# Patient Record
Sex: Female | Born: 1975 | Race: White | Hispanic: No | Marital: Married | Smoking: Never smoker
Health system: Southern US, Community
[De-identification: ages and names within clinical notes are randomized; demographics above are authoritative.]

---

## 1998-11-08 HISTORY — PX: BREAST CYST ASPIRATION: SHX578

## 2011-04-07 ENCOUNTER — Inpatient Hospital Stay: Payer: Self-pay | Admitting: Obstetrics and Gynecology

## 2012-03-21 ENCOUNTER — Ambulatory Visit: Payer: Self-pay

## 2012-11-16 ENCOUNTER — Inpatient Hospital Stay: Payer: Self-pay | Admitting: Obstetrics and Gynecology

## 2012-11-16 LAB — PIH PROFILE
BUN: 5 mg/dL — ABNORMAL LOW (ref 7–18)
Chloride: 108 mmol/L — ABNORMAL HIGH (ref 98–107)
Co2: 24 mmol/L (ref 21–32)
Creatinine: 0.53 mg/dL — ABNORMAL LOW (ref 0.60–1.30)
EGFR (African American): >60
EGFR (Non-African Amer.): >60
Glucose: 70 mg/dL (ref 65–99)
HCT: 35.8 % (ref 35.0–47.0)
HGB: 12.4 g/dL (ref 12.0–16.0)
MCH: 31.2 pg (ref 26.0–34.0)
MCHC: 34.8 g/dL (ref 32.0–36.0)
MCV: 90 fL (ref 80–100)
RDW: 13.3 % (ref 11.5–14.5)
SGOT(AST): 23 U/L (ref 15–37)
Uric Acid: 3.7 mg/dL (ref 2.6–6.0)
WBC: 12.5 10*3/uL — ABNORMAL HIGH (ref 3.6–11.0)

## 2012-11-16 LAB — PROTEIN / CREATININE RATIO, URINE
Creatinine, Urine: 33 mg/dL (ref 30.0–125.0)
Protein/Creat. Ratio: 6939 mg/gCREAT — ABNORMAL HIGH (ref 0–200)

## 2012-11-16 LAB — PLATELET COUNT: Platelet: 228 10*3/uL (ref 150–440)

## 2012-11-17 LAB — PIH PROFILE
BUN: 5 mg/dL — ABNORMAL LOW (ref 7–18)
Calcium, Total: 6.9 mg/dL — CL (ref 8.5–10.1)
Chloride: 109 mmol/L — ABNORMAL HIGH (ref 98–107)
Co2: 21 mmol/L (ref 21–32)
EGFR (Non-African Amer.): >60
Glucose: 152 mg/dL — ABNORMAL HIGH (ref 65–99)
HCT: 37.8 % (ref 35.0–47.0)
MCH: 30.6 pg (ref 26.0–34.0)
Osmolality: 281 (ref 275–301)
RDW: 13.2 % (ref 11.5–14.5)
Sodium: 141 mmol/L (ref 136–145)
WBC: 18 10*3/uL — ABNORMAL HIGH (ref 3.6–11.0)

## 2012-11-17 LAB — MAGNESIUM: Magnesium: 4.7 mg/dL — ABNORMAL HIGH

## 2012-11-18 LAB — BASIC METABOLIC PANEL
Anion Gap: 8 (ref 7–16)
BUN: 7 mg/dL (ref 7–18)
Calcium, Total: 6.8 mg/dL — CL (ref 8.5–10.1)
Chloride: 110 mmol/L — ABNORMAL HIGH (ref 98–107)
Co2: 25 mmol/L (ref 21–32)
EGFR (African American): >60
Osmolality: 282 (ref 275–301)
Potassium: 3 mmol/L — ABNORMAL LOW (ref 3.5–5.1)
Sodium: 143 mmol/L (ref 136–145)

## 2012-11-18 LAB — HEMATOCRIT: HCT: 33.6 % — ABNORMAL LOW (ref 35.0–47.0)

## 2012-11-19 LAB — BASIC METABOLIC PANEL
Anion Gap: 9 (ref 7–16)
Chloride: 109 mmol/L — ABNORMAL HIGH (ref 98–107)
Co2: 23 mmol/L (ref 21–32)
Creatinine: 0.59 mg/dL — ABNORMAL LOW (ref 0.60–1.30)
EGFR (Non-African Amer.): >60
Glucose: 79 mg/dL (ref 65–99)
Potassium: 3.1 mmol/L — ABNORMAL LOW (ref 3.5–5.1)
Sodium: 141 mmol/L (ref 136–145)

## 2012-11-20 LAB — PIH PROFILE
Anion Gap: 8 (ref 7–16)
BUN: 8 mg/dL (ref 7–18)
Calcium, Total: 8.1 mg/dL — ABNORMAL LOW (ref 8.5–10.1)
Chloride: 108 mmol/L — ABNORMAL HIGH (ref 98–107)
EGFR (African American): >60
Glucose: 80 mg/dL (ref 65–99)
HCT: 34.1 % — ABNORMAL LOW (ref 35.0–47.0)
HGB: 11.5 g/dL — ABNORMAL LOW (ref 12.0–16.0)
MCH: 31.2 pg (ref 26.0–34.0)
MCHC: 33.8 g/dL (ref 32.0–36.0)
MCV: 92 fL (ref 80–100)
Platelet: 313 10*3/uL (ref 150–440)
RBC: 3.69 10*6/uL — ABNORMAL LOW (ref 3.80–5.20)
RDW: 13.3 % (ref 11.5–14.5)
Uric Acid: 4.1 mg/dL (ref 2.6–6.0)

## 2014-10-14 ENCOUNTER — Ambulatory Visit: Payer: Self-pay | Admitting: Internal Medicine

## 2015-02-28 NOTE — Consult Note (Signed)
PATIENT NAME:  Jessica Frost, Jessica Frost MR#:  [id number] DATE OF BIRTH:  1975/12/14  DATE OF CONSULTATION:  11/17/2012  PRIMARY CARE PHYSICIAN: None.   REFERRING PHYSICIAN:  Dr. Levonne LappingEvans/Dr. Schermerhorn Obstetrician/Gynecology.    CONSULTING PHYSICIAN:  Rowan Pollman A. Allena KatzPatel, MD  REASON FOR CONSULTATION: Pregnancy-induced hypertension.   HISTORY OF PRESENT ILLNESS: The patient is a 39 year old Caucasian female with no significant past medical history, was admitted on the Labor and Delivery Suite and who delivered a healthy baby boy on 11/17/2012.  The patient was admitted with preeclampsia and is currently receiving IV Maxalt.  Postpartum, the patient's blood pressure remained anywhere from systolic 160s to 578I180s. Her diastolic is anywhere from 90s to 116s. She had earlier a headache, which resolved with ibuprofen. Denies any fuzzy vision. No seizures were reported. The patient did not have issues with blood pressure in her prior pregnancy; however, she tells me she has not lost all the weight from her previous pregnancy, which was 19 months ago. Denies any chest pain, shortness of breath. Internal medicine was consulted for blood pressure management.   PAST MEDICAL HISTORY: None.   MEDICATIONS: Prenatal vitamins.   CURRENT Hospital medications: Are:   1.  Maxalt drip.  2.  Dextrose 5% lactated ringers.  3.  Tylenol 650 every 3 to 4 hours p.r.n.   4.  Hydrocodone.  5.  Norco 5/325 one to two every four to six hours p.r.n.  6.  Bisacodyl suppository 10 mg rectal every six p.r.n.  7.  Maalox 30 mL every 4 to 6 p.r.n.  8.  Ibuprofen 600 mg p.o. every 6 p.r.n. for pain or temperature.  9.  Milk of magnesia 30 mL p.o. at bedtime p.r.n.  10.  Simethicone 80 mg before meals and at bedtime p.r.n.  11.  Temazepam 30 mg at bedtime p.r.n. for insomnia.  12.  Tucks single pad 1 to 2 hourly p.r.n. for perineal discomfort.  13.  Promethazine 25 mg every 4 p.r.n. for nausea, vomiting, motion sickness.   FAMILY HISTORY:  Positive for diabetes in father. Mother had GERD.   ALLERGIES: No known drug allergies.   SOCIAL HISTORY: Nonsmoker, nonalcoholic. She is a Futures traderhomemaker.   Review of systems:  CONSTITUTIONAL: No fever, fatigue, weakness.  EYES: No blurred or double vision.  ENT: No hearing loss, tinnitus, or ear pain.  RESPIRATORY: No cough, wheeze, hemoptysis.  CARDIOVASCULAR: No chest pain, orthopnea, or edema.  GASTROINTESTINAL: No nausea, vomiting, diarrhea, or abdominal pain.  GENITOURINARY: No dysuria or hematuria.  ENDOCRINE: No polyuria or nocturia.  HEMATOLOGY: No anemia or easy bruising.  SKIN: No acne or rash.  MUSCULOSKELETAL: No arthritis or joint pain.  NEUROLOGIC: No CVA, TIA, or seizures.  PSYCHIATRIC: No anxiety or depression.   All other systems reviewed and negative.   PHYSICAL EXAMINATION:  GENERAL: The patient is awake, alert, oriented x3, not in acute distress.  VITAL SIGNS: Afebrile.  Pulse is 79, regular. Blood pressure is 164/99.   HEAD, EYES, EARS, NOSE, AND THROAT: Atraumatic, normocephalic. Pupils PERRLA.  EOMI intact.  Oral mucosa is moist.  NECK: Supple. No JVD. No carotid bruit.  RESPIRATORY: Clear to auscultation bilaterally. No rales, rhonchi, respiratory distress, or labored breathing.  CARDIOVASCULAR: Both the heart sounds are normal. Rate, rhythm is regular. PMI not lateralized. Chest is nontender.  EXTREMITIES: Good pedal pulses. Good femoral pulses. There is 2+ pitting edema in both lower extremities, more in the ankle and feet.  NEUROLOGIC: Grossly intact cranial nerves II through XII. No motor  or sensory deficits.  PSYCHiatric: The patient is awake, alert, oriented x3.  SKIN: Warm and dry.  ABDOMEN: Soft, benign, and nontender. No organomegaly.   Laboratory data: White count is 18.0, hemoglobin and hematocrit is 12.8 and 37.8, platelet count is 272. SGOT is 22. BUN is 5. Creatinine is 0.6. Glucose is 152. Sodium is 141, potassium is 3.1, chloride is 109,  bicarbonate 21, calcium 6.9. Calcium yesterday on November 16, 2012, was 8.5. Platelet count was 228. RPR is nonreactive.   ASSESSMENT: A 39 year old patient with no significant past medical history is postpartum day #0.  Internal Medicine consulted for:  1.  Pregnancy-induced hypertension/preeclampsia: The patient delivered a healthy baby boy on 11/17/2012. Postdelivery continues to have elevated systolic blood pressure in the range of 160s to 180s, diastolic  to 116. She had a headache earlier, no blurred vision. Currently, getting IV mag sulfate as part of protocol for suspected preeclampsia. We will start her on labetalol 100 mg b.i.d. and give p.r.n. hydralazine. The patient is breast-feeding the baby.  We will adjust the dosage according to the blood pressure readings.  2.  Hypokalemia: Give a dose of Klor-Con tonight. Check metabolic panel in the morning.  3.  Leukocytosis: Appears likely reactive in the setting of pregnancy. The patient does not have any fever. No source of infection noted at this time.  4.  Deep vein thrombosis prophylaxis: The patient has sequential compression devices on.  5.  The above was discussed with Dr. Logan Bores. A plan was also discussed with the patient and the patient's husband, who was present in the room.   Thank you for the consult. We will follow while the patient is inhouse.    TIME SPENT: Fifty-five minutes.     ____________________________ Wylie Hail Allena Katz, MD sap:th D: 11/17/2012 18:22:36 ET T: 11/17/2012 21:38:39 ET JOB#: [id number]  cc: Devonn Giampietro A. Allena Katz, MD, <Dictator> Willow Ora MD ELECTRONICALLY SIGNED 11/21/2012 7:27

## 2015-03-18 NOTE — H&P (Signed)
L&D Evaluation:  History:   HPI 39 y/[facility] [place] [facility] due to elevated blood pressures. Denies s s/sx preeclampsia no headache, visual disturbances, RUQ or epigastric pain, nausea or vomiting. Obesity, AMA, GBS unknown (done today)    Presents with above    Patient's Medical History No Chronic Illness    Patient's Surgical History none    Medications Pre Natal Vitamins    Allergies NKDA    Social History none   ROS:   ROS All systems were reviewed.  HEENT, CNS, GI, GU, Respiratory, CV, Renal and Musculoskeletal systems were found to be normal.   Exam:   Vital Signs BP >140/90  190/113  172/72  160/95  159/94    Urine Protein random protein 229. protein creatinine ratio 6939    General no apparent distress    Mental Status clear    Chest clear    Abdomen gravid, non-tender    Estimated Fetal Weight Average for gestational age    Fetal Position vtx    Fundal Height appropriate    Back no CVAT    Edema 3+  Pitting    Reflexes 2+    Clonus negative    Pelvic no external lesions, 4-5cm 50% vtx @ -2 BOWI sm show    Mebranes Intact    FHT EFM reapplied baseline 130's avg variability  with accels    Ucx occasional    Skin dry    Lymph no lymphadenopathy   Impression:   Impression 36wks Pregnancy Induced Hypertension   Plan:   Plan EFM/NST, monitor contractions and for cervical change, PIH panel, antibiotics for GBBS prophylaxis    Comments Admitted, explained what to expect with Preterm IOL (advanced cervical dilation) due to Pregnancy Induced Hypertension (Abn labs pro/creat ratio 6939, platlets/uric acid nl) Care of baby, IOL with pitocin, IV ABX, Magnesium sulfate administration. TJS consulted and in agreement with plan of care. Plans epidural with labor progress. Husband on his way, supportive.   Electronic Signatures: Albertina ParrLugiano, Olon Russ B (CNM)  (Signed 09-Jan-14 14:37)  Authored: L&D Evaluation   Last Updated:  09-Jan-14 14:37 by Albertina ParrLugiano, Xzaiver Vayda B (CNM)

## 2015-08-12 ENCOUNTER — Other Ambulatory Visit: Payer: Self-pay | Admitting: Internal Medicine

## 2015-08-12 DIAGNOSIS — Z1231 Encounter for screening mammogram for malignant neoplasm of breast: Secondary | ICD-10-CM

## 2015-10-17 ENCOUNTER — Ambulatory Visit
Admission: RE | Admit: 2015-10-17 | Discharge: 2015-10-17 | Disposition: A | Discharge status: Home / Self Care | Payer: 59 | Source: Ambulatory Visit | Attending: Internal Medicine | Admitting: Internal Medicine

## 2015-10-17 DIAGNOSIS — Z1231 Encounter for screening mammogram for malignant neoplasm of breast: Secondary | ICD-10-CM | POA: Insufficient documentation

## 2016-09-13 ENCOUNTER — Other Ambulatory Visit: Payer: Self-pay | Admitting: Internal Medicine

## 2016-09-13 DIAGNOSIS — Z1231 Encounter for screening mammogram for malignant neoplasm of breast: Secondary | ICD-10-CM

## 2016-10-25 ENCOUNTER — Ambulatory Visit
Admission: RE | Admit: 2016-10-25 | Discharge: 2016-10-25 | Disposition: A | Discharge status: Home / Self Care | Payer: 59 | Source: Ambulatory Visit | Attending: Internal Medicine | Admitting: Internal Medicine

## 2016-10-25 DIAGNOSIS — R928 Other abnormal and inconclusive findings on diagnostic imaging of breast: Secondary | ICD-10-CM | POA: Insufficient documentation

## 2016-10-25 DIAGNOSIS — Z1231 Encounter for screening mammogram for malignant neoplasm of breast: Secondary | ICD-10-CM | POA: Diagnosis not present

## 2016-10-27 ENCOUNTER — Other Ambulatory Visit: Payer: Self-pay | Admitting: Internal Medicine

## 2016-10-27 DIAGNOSIS — R928 Other abnormal and inconclusive findings on diagnostic imaging of breast: Secondary | ICD-10-CM

## 2016-10-27 DIAGNOSIS — N6320 Unspecified lump in the left breast, unspecified quadrant: Secondary | ICD-10-CM

## 2016-11-15 ENCOUNTER — Ambulatory Visit
Admission: RE | Admit: 2016-11-15 | Discharge: 2016-11-15 | Disposition: A | Discharge status: Home / Self Care | Payer: 59 | Source: Ambulatory Visit | Attending: Internal Medicine | Admitting: Internal Medicine

## 2016-11-15 DIAGNOSIS — R928 Other abnormal and inconclusive findings on diagnostic imaging of breast: Secondary | ICD-10-CM

## 2016-11-15 DIAGNOSIS — N6320 Unspecified lump in the left breast, unspecified quadrant: Secondary | ICD-10-CM

## 2017-01-25 DIAGNOSIS — Z01419 Encounter for gynecological examination (general) (routine) without abnormal findings: Secondary | ICD-10-CM | POA: Diagnosis not present

## 2017-04-27 DIAGNOSIS — Z79899 Other long term (current) drug therapy: Secondary | ICD-10-CM | POA: Diagnosis not present

## 2017-05-03 DIAGNOSIS — Z1231 Encounter for screening mammogram for malignant neoplasm of breast: Secondary | ICD-10-CM | POA: Diagnosis not present

## 2017-05-03 DIAGNOSIS — K580 Irritable bowel syndrome with diarrhea: Secondary | ICD-10-CM | POA: Diagnosis not present

## 2017-05-03 DIAGNOSIS — Z0000 Encounter for general adult medical examination without abnormal findings: Secondary | ICD-10-CM | POA: Diagnosis not present

## 2017-08-29 DIAGNOSIS — Z23 Encounter for immunization: Secondary | ICD-10-CM | POA: Diagnosis not present

## 2017-11-14 ENCOUNTER — Other Ambulatory Visit: Payer: Self-pay | Admitting: Internal Medicine

## 2017-11-14 DIAGNOSIS — Z1231 Encounter for screening mammogram for malignant neoplasm of breast: Secondary | ICD-10-CM

## 2017-12-01 ENCOUNTER — Ambulatory Visit
Admission: RE | Admit: 2017-12-01 | Discharge: 2017-12-01 | Disposition: A | Discharge status: Home / Self Care | Payer: 59 | Source: Ambulatory Visit | Attending: Internal Medicine | Admitting: Internal Medicine

## 2017-12-01 DIAGNOSIS — Z1231 Encounter for screening mammogram for malignant neoplasm of breast: Secondary | ICD-10-CM | POA: Diagnosis not present

## 2018-05-04 DIAGNOSIS — Z0000 Encounter for general adult medical examination without abnormal findings: Secondary | ICD-10-CM | POA: Diagnosis not present

## 2018-05-04 DIAGNOSIS — Z1329 Encounter for screening for other suspected endocrine disorder: Secondary | ICD-10-CM | POA: Diagnosis not present

## 2018-05-04 DIAGNOSIS — Z13220 Encounter for screening for lipoid disorders: Secondary | ICD-10-CM | POA: Diagnosis not present

## 2018-06-12 IMAGING — MG MM DIGITAL SCREENING BILAT W/ TOMO W/ CAD
8 of 13 series · 8 of 29 positions shown · non-contrast
Comparison: Previous exam(s).

CLINICAL DATA: Screening.

EXAM:
2D DIGITAL SCREENING BILATERAL MAMMOGRAM WITH CAD AND ADJUNCT TOMO

[L MLO]
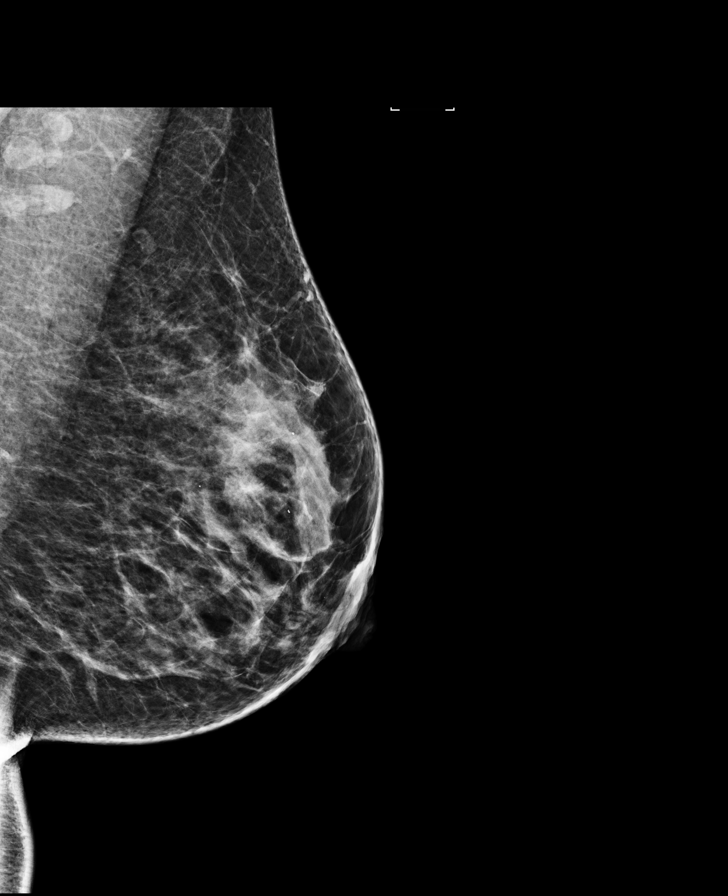

[L CC synth-2D]
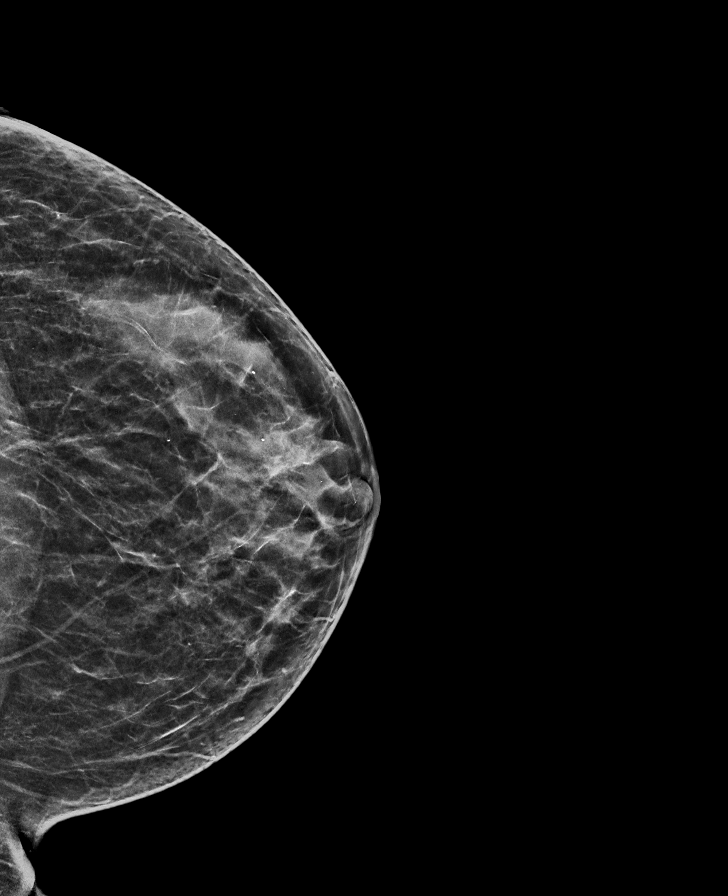

[R MLO]
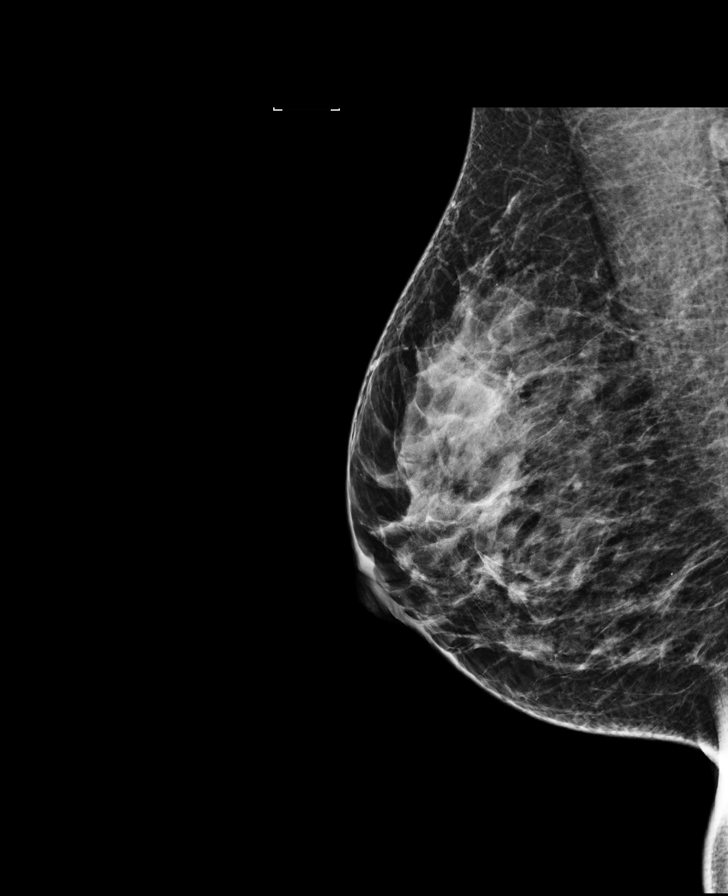

[L MLO synth-2D]
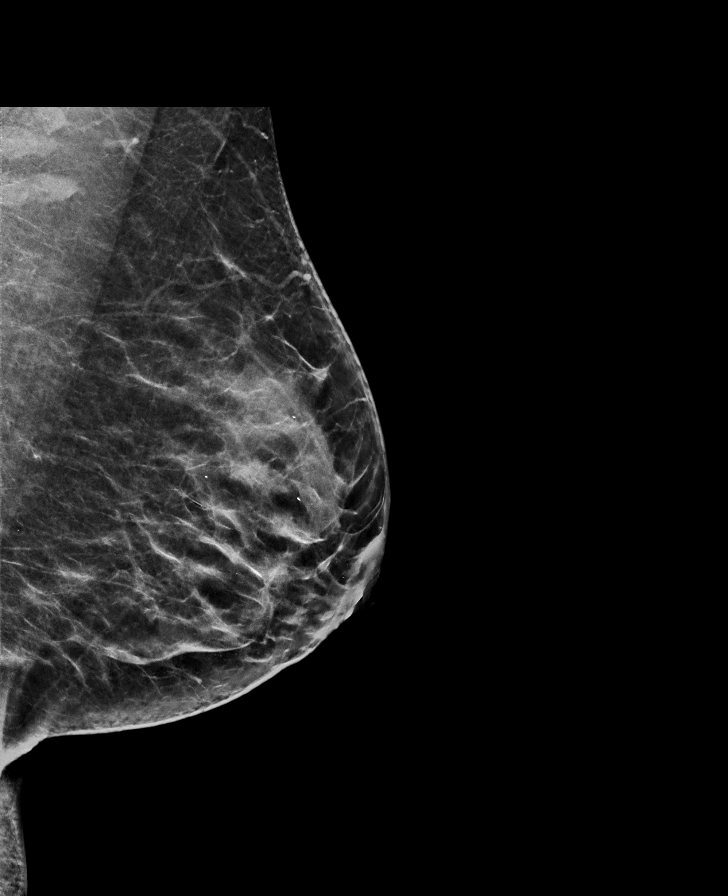

[R CC]
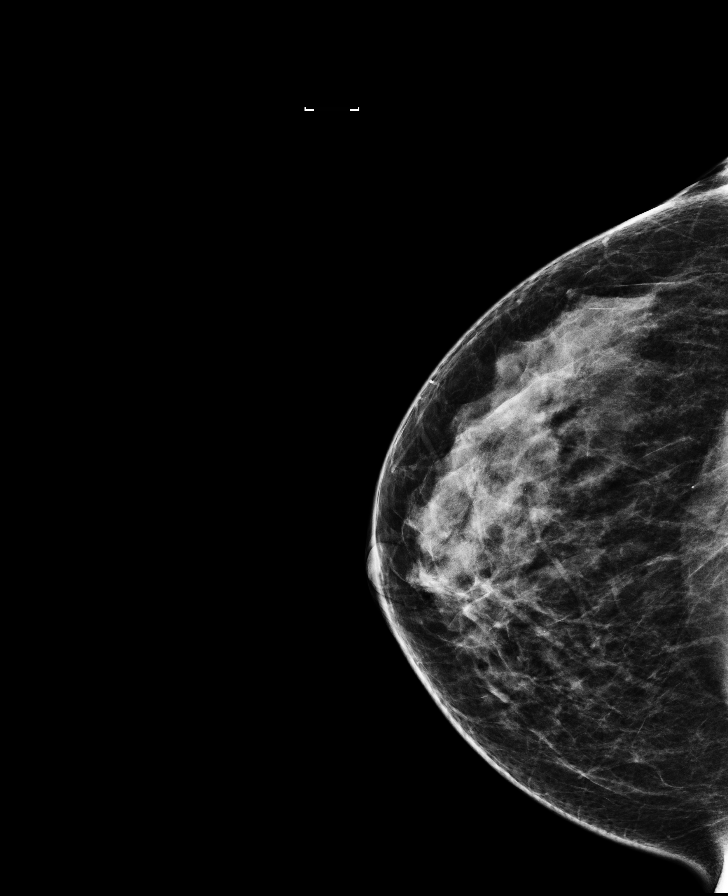

[R MLO synth-2D]
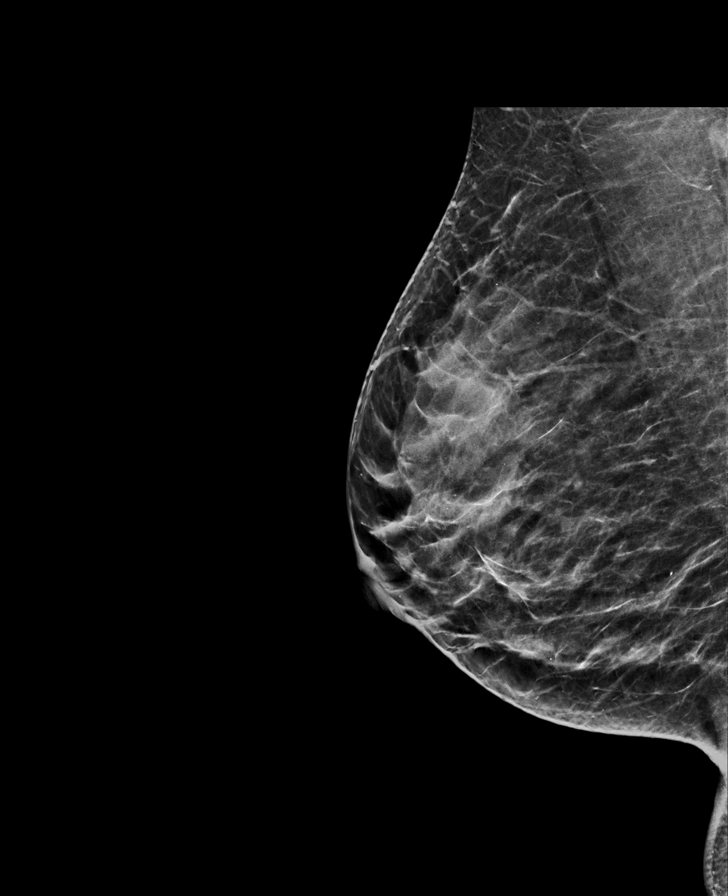

[R CC synth-2D]
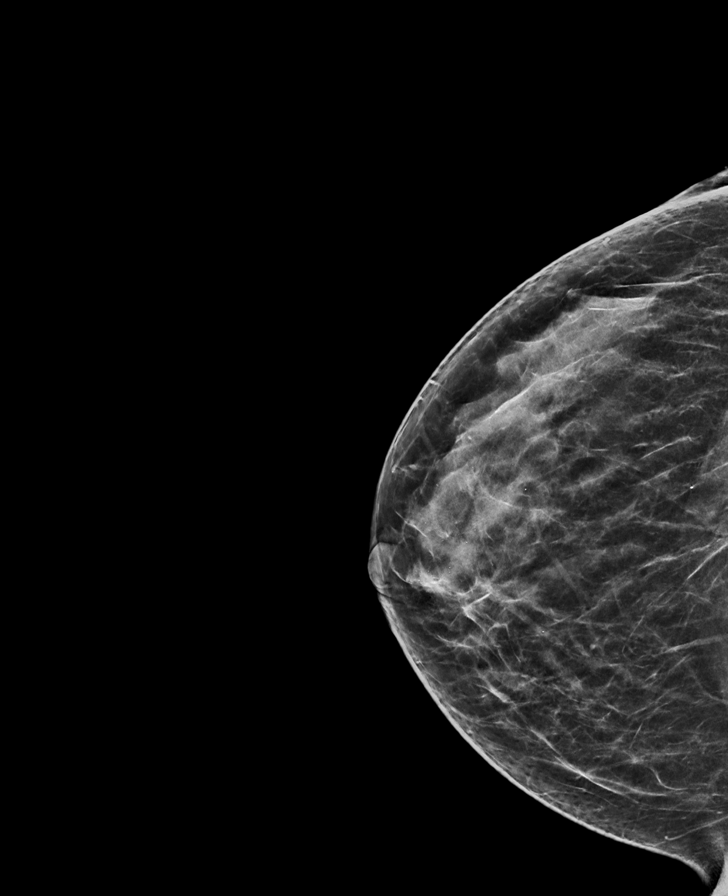

[L CC]
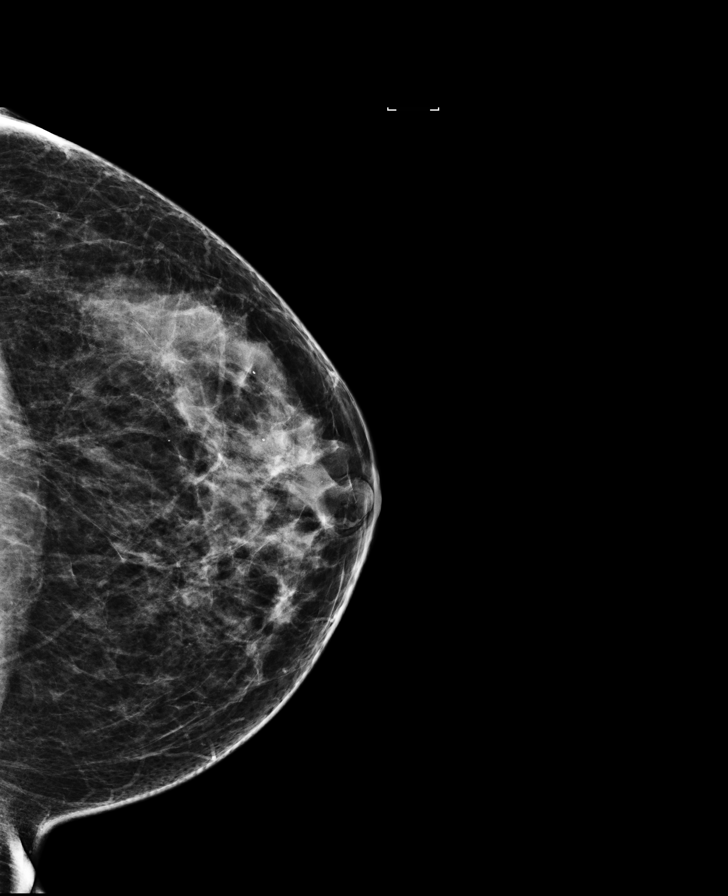

[8 of 29 positions shown; findings below may reference images not displayed]

ACR Breast Density Category c: The breast tissue is heterogeneously
dense, which may obscure small masses.
FINDINGS: In the left breast, a possible mass warrants further evaluation. In
the right breast, no findings suspicious for malignancy.

Images were processed with CAD.
IMPRESSION: Further evaluation is suggested for possible mass in the left
breast.

RECOMMENDATION:
Diagnostic mammogram and possibly ultrasound of the left breast.
(Code:[id number]-OOA)

The patient will be contacted regarding the findings, and additional
imaging will be scheduled.

BI-RADS CATEGORY  0: Incomplete. Need additional imaging evaluation
and/or prior mammograms for comparison.

## 2018-08-29 DIAGNOSIS — Z23 Encounter for immunization: Secondary | ICD-10-CM | POA: Diagnosis not present

## 2018-11-10 ENCOUNTER — Other Ambulatory Visit: Payer: Self-pay | Admitting: Internal Medicine

## 2018-11-10 DIAGNOSIS — Z1231 Encounter for screening mammogram for malignant neoplasm of breast: Secondary | ICD-10-CM

## 2018-12-05 ENCOUNTER — Ambulatory Visit
Admission: RE | Admit: 2018-12-05 | Discharge: 2018-12-05 | Disposition: A | Discharge status: Home / Self Care | Payer: 59 | Source: Ambulatory Visit | Attending: Internal Medicine | Admitting: Internal Medicine

## 2018-12-05 DIAGNOSIS — Z1231 Encounter for screening mammogram for malignant neoplasm of breast: Secondary | ICD-10-CM | POA: Diagnosis present

## 2019-11-12 ENCOUNTER — Other Ambulatory Visit: Payer: Self-pay | Admitting: Internal Medicine

## 2019-11-13 ENCOUNTER — Other Ambulatory Visit: Payer: Self-pay | Admitting: Internal Medicine

## 2019-11-13 DIAGNOSIS — Z1231 Encounter for screening mammogram for malignant neoplasm of breast: Secondary | ICD-10-CM

## 2019-12-12 ENCOUNTER — Ambulatory Visit
Admission: RE | Admit: 2019-12-12 | Discharge: 2019-12-12 | Disposition: A | Discharge status: Home / Self Care | Payer: 59 | Source: Ambulatory Visit | Attending: Internal Medicine | Admitting: Internal Medicine

## 2019-12-12 DIAGNOSIS — Z1231 Encounter for screening mammogram for malignant neoplasm of breast: Secondary | ICD-10-CM | POA: Diagnosis not present

## 2020-10-08 ENCOUNTER — Other Ambulatory Visit: Payer: Self-pay | Admitting: Internal Medicine

## 2020-10-08 DIAGNOSIS — Z1231 Encounter for screening mammogram for malignant neoplasm of breast: Secondary | ICD-10-CM

## 2020-12-15 ENCOUNTER — Ambulatory Visit
Admission: RE | Admit: 2020-12-15 | Discharge: 2020-12-15 | Disposition: A | Discharge status: Home / Self Care | Payer: 59 | Source: Ambulatory Visit | Attending: Internal Medicine | Admitting: Internal Medicine

## 2020-12-15 ENCOUNTER — Other Ambulatory Visit: Payer: Self-pay

## 2020-12-15 DIAGNOSIS — Z1231 Encounter for screening mammogram for malignant neoplasm of breast: Secondary | ICD-10-CM | POA: Diagnosis not present

## 2021-11-18 ENCOUNTER — Other Ambulatory Visit: Payer: Self-pay | Admitting: Internal Medicine

## 2021-11-18 DIAGNOSIS — Z1231 Encounter for screening mammogram for malignant neoplasm of breast: Secondary | ICD-10-CM

## 2021-12-22 ENCOUNTER — Other Ambulatory Visit: Payer: Self-pay

## 2021-12-22 ENCOUNTER — Ambulatory Visit
Admission: RE | Admit: 2021-12-22 | Discharge: 2021-12-22 | Disposition: A | Discharge status: Home / Self Care | Payer: 59 | Source: Ambulatory Visit | Attending: Internal Medicine | Admitting: Internal Medicine

## 2021-12-22 DIAGNOSIS — Z1231 Encounter for screening mammogram for malignant neoplasm of breast: Secondary | ICD-10-CM | POA: Insufficient documentation

## 2022-08-02 ENCOUNTER — Other Ambulatory Visit: Payer: Self-pay

## 2022-08-02 ENCOUNTER — Telehealth: Payer: Self-pay

## 2022-08-02 DIAGNOSIS — Z1211 Encounter for screening for malignant neoplasm of colon: Secondary | ICD-10-CM

## 2022-08-02 MED ORDER — NA SULFATE-K SULFATE-MG SULF 17.5-3.13-1.6 GM/177ML PO SOLN: 354.0000 mL | Freq: Once | ORAL | 0 refills | Status: AC

## 2022-08-02 NOTE — Telephone Encounter (Signed)
Gastroenterology Pre-Procedure Review  Request Date:  Requesting Physician: Dr. Marius Ditch Patient is going to call back when she checks with her husband   PATIENT REVIEW QUESTIONS: The patient responded to the following health history questions as indicated:    1. Are you having any GI issues? no 2. Do you have a personal history of Polyps? no 3. Do you have a family history of Colon Cancer or Polyps? no 4. Diabetes Mellitus? no 5. Joint replacements in the past 12 months?no 6. Major health problems in the past 3 months?no 7. Any artificial heart valves, MVP, or defibrillator?no    MEDICATIONS & ALLERGIES:    Patient reports the following regarding taking any anticoagulation/antiplatelet therapy:   Plavix, Coumadin, Eliquis, Xarelto, Lovenox, Pradaxa, Brilinta, or Effient? no Aspirin? no  Patient confirms/reports the following medications:  No current outpatient medications on file.   No current facility-administered medications for this visit.    Patient confirms/reports the following allergies:  Not on File  No orders of the defined types were placed in this encounter.   AUTHORIZATION INFORMATION Primary Insurance: 1D#: Group #:  Secondary Insurance: 1D#: Group #:  SCHEDULE INFORMATION: Date:  Time: Location:

## 2022-08-02 NOTE — Telephone Encounter (Signed)
Patient is going to 08/30/2022

## 2022-08-02 NOTE — Addendum Note (Signed)
Addended by: Ulyess Blossom L on: 08/02/2022 09:05 AM   Modules accepted: Orders

## 2022-08-25 ENCOUNTER — Telehealth: Payer: Self-pay | Admitting: *Deleted

## 2022-08-25 NOTE — Telephone Encounter (Signed)
Patient called office to reschedule colonoscopy from 08/30/2022 to the new date of 09/22/2022.  New instruction have been sent. Patient verbalized understanding.  Spoken to [place] at [facility] endo unit to make the change.

## 2022-08-30 ENCOUNTER — Ambulatory Visit
Admission: RE | Admit: 2022-08-30 | Discharge: 2022-08-30 | Disposition: A | Discharge status: Home / Self Care | Payer: 59 | Attending: Gastroenterology | Admitting: Gastroenterology

## 2022-08-30 ENCOUNTER — Ambulatory Visit: Payer: 59 | Admitting: Certified Registered"

## 2022-08-30 ENCOUNTER — Encounter: Payer: Self-pay | Admitting: Gastroenterology

## 2022-08-30 ENCOUNTER — Encounter
Admission: RE | Disposition: A | Discharge status: Home / Self Care | Payer: Self-pay | Source: Home / Self Care | Attending: Gastroenterology

## 2022-08-30 ENCOUNTER — Other Ambulatory Visit: Payer: Self-pay

## 2022-08-30 DIAGNOSIS — Z1211 Encounter for screening for malignant neoplasm of colon: Secondary | ICD-10-CM | POA: Diagnosis present

## 2022-08-30 HISTORY — PX: COLONOSCOPY WITH PROPOFOL: SHX5780

## 2022-08-30 LAB — POCT PREGNANCY, URINE: Preg Test, Ur: NEGATIVE

## 2022-08-30 SURGERY — COLONOSCOPY WITH PROPOFOL
Anesthesia: General

## 2022-08-30 MED ORDER — PROPOFOL 500 MG/50ML IV EMUL
INTRAVENOUS | Status: DC | PRN
  Administered 2022-08-30: 140 ug/kg/min via INTRAVENOUS

## 2022-08-30 MED ORDER — LIDOCAINE HCL (CARDIAC) PF 100 MG/5ML IV SOSY
PREFILLED_SYRINGE | INTRAVENOUS | Status: DC | PRN
  Administered 2022-08-30: 50 mg via INTRAVENOUS

## 2022-08-30 MED ORDER — PROPOFOL 10 MG/ML IV BOLUS
INTRAVENOUS | Status: DC | PRN
  Administered 2022-08-30: 80 mg via INTRAVENOUS

## 2022-08-30 MED ORDER — SODIUM CHLORIDE 0.9 % IV SOLN: INTRAVENOUS | Status: DC

## 2022-08-30 MED ORDER — GLYCOPYRROLATE 0.2 MG/ML IJ SOLN
INTRAMUSCULAR | Status: DC | PRN
  Administered 2022-08-30: .2 mg via INTRAVENOUS

## 2022-08-30 NOTE — H&P (Signed)
  Cephas Darby, MD [address]  [place]  [place], [place] [address]  Main: [phone]  Fax: [phone] Pager: [phone]  Primary Care Physician:  Idelle Crouch, MD Primary Gastroenterologist:  Dr. Cephas Darby  Pre-Procedure History & Physical: HPI:  Jessica Frost is a 46 y.o. female is here for an colonoscopy.   History reviewed. No pertinent past medical history.  Past Surgical History:  Procedure Laterality Date   BREAST CYST ASPIRATION Left 2000   neg    Prior to Admission medications   Not on File    Allergies as of 08/02/2022   (Not on File)    Family History  Problem Relation Age of Onset   Breast cancer Neg Hx     Social History   Socioeconomic History   Marital status: Married    Spouse name: Not on file   Number of children: Not on file   Years of education: Not on file   Highest education level: Not on file  Occupational History   Not on file  Tobacco Use   Smoking status: Never   Smokeless tobacco: Never  Vaping Use   Vaping Use: Never used  Substance and Sexual Activity   Alcohol use: Never   Drug use: Never   Sexual activity: Not on file  Other Topics Concern   Not on file  Social History Narrative   Not on file   Social Determinants of Health   Financial Resource Strain: Not on file  Food Insecurity: Not on file  Transportation Needs: Not on file  Physical Activity: Not on file  Stress: Not on file  Social Connections: Not on file  Intimate Partner Violence: Not on file    Review of Systems: See HPI, otherwise negative ROS  Physical Exam: BP 107/68   Pulse 80   Temp (!) 97.5 F (36.4 C) (Temporal)   Resp 16   Ht 5\' 3"  (1.6 m)   Wt 76.2 kg   SpO2 100%   BMI 29.76 kg/m  General:   Alert,  pleasant and cooperative in NAD Head:  Normocephalic and atraumatic. Neck:  Supple; no masses or thyromegaly. Lungs:  Clear throughout to auscultation.    Heart:  Regular rate and rhythm. Abdomen:  Soft,  nontender and nondistended. Normal bowel sounds, without guarding, and without rebound.   Neurologic:  Alert and  oriented x4;  grossly normal neurologically.  Impression/Plan: Jessica Frost is here for an colonoscopy to be performed for colon cancer screening  Risks, benefits, limitations, and alternatives regarding  colonoscopy have been reviewed with the patient.  Questions have been answered.  All parties agreeable.   Sherri Sear, MD  08/30/2022, 9:59 AM

## 2022-08-30 NOTE — Transfer of Care (Signed)
Immediate Anesthesia Transfer of Care Note  Patient: Jessica Frost  Procedure(s) Performed: COLONOSCOPY WITH PROPOFOL  Patient Location: Endoscopy Unit  Anesthesia Type:General  Level of Consciousness: drowsy  Airway & Oxygen Therapy: Patient Spontanous Breathing  Post-op Assessment: Report given to RN  Post vital signs: stable  Last Vitals:  Vitals Value Taken Time  BP    Temp    Pulse    Resp    SpO2      Last Pain:  Vitals:   08/30/22 0918  TempSrc: Temporal  PainSc: 0-No pain         Complications: No notable events documented.

## 2022-08-30 NOTE — Anesthesia Postprocedure Evaluation (Signed)
Anesthesia Post Note  Patient: Jessica Frost  Procedure(s) Performed: COLONOSCOPY WITH PROPOFOL  Patient location during evaluation: PACU Anesthesia Type: General Level of consciousness: awake and oriented Pain management: pain level controlled Vital Signs Assessment: post-procedure vital signs reviewed and stable Respiratory status: spontaneous breathing and respiratory function stable Cardiovascular status: stable Anesthetic complications: no   No notable events documented.   Last Vitals:  Vitals:   08/30/22 1027 08/30/22 1037  BP: (!) 104/59 100/64  Pulse: 70 71  Resp: 16 13  Temp:    SpO2: 97% 96%    Last Pain:  Vitals:   08/30/22 1037  TempSrc:   PainSc: 0-No pain                 VAN STAVEREN,Mackson Botz

## 2022-08-30 NOTE — Anesthesia Preprocedure Evaluation (Signed)
Anesthesia Evaluation  Patient identified by MRN, date of birth, ID band Patient awake    Reviewed: Allergy & Precautions, NPO status , Patient's Chart, lab work & pertinent test results  Airway Mallampati: II  TM Distance: >3 FB Neck ROM: Full    Dental  (+) Teeth Intact   Pulmonary neg pulmonary ROS,    Pulmonary exam normal breath sounds clear to auscultation       Cardiovascular Exercise Tolerance: Good negative cardio ROS Normal cardiovascular exam Rhythm:Regular Rate:Normal     Neuro/Psych negative neurological ROS  negative psych ROS   GI/Hepatic negative GI ROS, Neg liver ROS,   Endo/Other  negative endocrine ROS  Renal/GU negative Renal ROS  negative genitourinary   Musculoskeletal negative musculoskeletal ROS (+)   Abdominal Normal abdominal exam  (+)   Peds negative pediatric ROS (+)  Hematology negative hematology ROS (+)   Anesthesia Other Findings History reviewed. No pertinent past medical history.  Past Surgical History: 2000: BREAST CYST ASPIRATION; Left     Comment:  neg  BMI    Body Mass Index: 29.76 kg/m      Reproductive/Obstetrics negative OB ROS                             Anesthesia Physical Anesthesia Plan  ASA: 1  Anesthesia Plan: General   Post-op Pain Management:    Induction: Intravenous  PONV Risk Score and Plan: Propofol infusion and TIVA  Airway Management Planned: Natural Airway  Additional Equipment:   Intra-op Plan:   Post-operative Plan:   Informed Consent: I have reviewed the patients History and Physical, chart, labs and discussed the procedure including the risks, benefits and alternatives for the proposed anesthesia with the patient or authorized representative who has indicated his/her understanding and acceptance.     Dental Advisory Given  Plan Discussed with: CRNA and Surgeon  Anesthesia Plan Comments:          Anesthesia Quick Evaluation

## 2022-08-30 NOTE — Op Note (Signed)
[facility] Gastroenterology Patient Name: Jessica Frost Procedure Date: 08/30/2022 9:55 AM MRN: [id number] Account #: [id number] Date of Birth: 06/01/1976 Admit Type: Outpatient Age: 46 Room: [facility] ENDO ROOM 2 Gender: Female Note Status: Finalized Instrument Name: Jasper Riling [id number] Procedure:             Colonoscopy Indications:           Screening for colorectal malignant neoplasm, This is                         the patient's first colonoscopy Providers:             Lin Landsman MD, MD Medicines:             General Anesthesia Complications:         No immediate complications. Estimated blood loss: None. Procedure:             Pre-Anesthesia Assessment:                        - Prior to the procedure, a History and Physical was                         performed, and patient medications and allergies were                         reviewed. The patient is competent. The risks and                         benefits of the procedure and the sedation options and                         risks were discussed with the patient. All questions                         were answered and informed consent was obtained.                         Patient identification and proposed procedure were                         verified by the physician, the nurse, the                         anesthesiologist, the anesthetist and the technician                         in the pre-procedure area in the procedure room in the                         endoscopy suite. Mental Status Examination: alert and                         oriented. Airway Examination: normal oropharyngeal                         airway and neck mobility. Respiratory Examination:                         clear to auscultation.  CV Examination: normal.                         Prophylactic Antibiotics: The patient does not require                         prophylactic antibiotics. Prior Anticoagulants: The                          patient has taken no previous anticoagulant or                         antiplatelet agents. ASA Grade Assessment: II - A                         patient with mild systemic disease. After reviewing                         the risks and benefits, the patient was deemed in                         satisfactory condition to undergo the procedure. The                         anesthesia plan was to use general anesthesia.                         Immediately prior to administration of medications,                         the patient was re-assessed for adequacy to receive                         sedatives. The heart rate, respiratory rate, oxygen                         saturations, blood pressure, adequacy of pulmonary                         ventilation, and response to care were monitored                         throughout the procedure. The physical status of the                         patient was re-assessed after the procedure.                        After obtaining informed consent, the colonoscope was                         passed under direct vision. Throughout the procedure,                         the patient's blood pressure, pulse, and oxygen                         saturations were monitored continuously. The  Colonoscope was introduced through the anus and                         advanced to the the cecum, identified by appendiceal                         orifice and ileocecal valve. The colonoscopy was                         performed without difficulty. The patient tolerated                         the procedure well. The quality of the bowel                         preparation was evaluated using the BBPS [facility] Bowel                         Preparation Scale) with scores of: Right Colon = 3,                         Transverse Colon = 3 and Left Colon = 3 (entire mucosa                         seen well with no residual staining, small  fragments                         of stool or opaque liquid). The total BBPS score                         equals 9. Findings:      The perianal and digital rectal examinations were normal. Pertinent       negatives include normal sphincter tone and no palpable rectal lesions.      The entire examined colon appeared normal.      The retroflexed view of the distal rectum and anal verge was normal and       showed no anal or rectal abnormalities. Impression:            - The entire examined colon is normal.                        - The distal rectum and anal verge are normal on                         retroflexion view.                        - No specimens collected. Recommendation:        - Discharge patient to home (with escort).                        - Resume previous diet today.                        - Continue present medications.                        - Repeat colonoscopy in 10 years  for screening                         purposes. Procedure Code(s):     --- Professional ---                        O7096, Colorectal cancer screening; colonoscopy on                         individual not meeting criteria for high risk Diagnosis Code(s):     --- Professional ---                        Z12.11, Encounter for screening for malignant neoplasm                         of colon CPT copyright 2019 American Medical Association. All rights reserved. The codes documented in this report are preliminary and upon coder review may  be revised to meet current compliance requirements. Dr. Libby Maw Toney Reil MD, MD 08/30/2022 10:18:30 AM This report has been signed electronically. Number of Addenda: 0 Note Initiated On: 08/30/2022 9:55 AM Scope Withdrawal Time: 0 hours 6 minutes 45 seconds  Total Procedure Duration: 0 hours 10 minutes 53 seconds  Estimated Blood Loss:  Estimated blood loss: none.      [facility]

## 2022-11-23 ENCOUNTER — Other Ambulatory Visit: Payer: Self-pay | Admitting: Internal Medicine

## 2022-11-23 DIAGNOSIS — Z1231 Encounter for screening mammogram for malignant neoplasm of breast: Secondary | ICD-10-CM

## 2022-12-23 ENCOUNTER — Ambulatory Visit
Admission: RE | Admit: 2022-12-23 | Discharge: 2022-12-23 | Disposition: A | Discharge status: Home / Self Care | Payer: 59 | Source: Ambulatory Visit | Attending: Internal Medicine | Admitting: Internal Medicine

## 2022-12-23 DIAGNOSIS — Z1231 Encounter for screening mammogram for malignant neoplasm of breast: Secondary | ICD-10-CM | POA: Insufficient documentation

## 2023-07-20 ENCOUNTER — Other Ambulatory Visit: Payer: Self-pay

## 2023-07-20 DIAGNOSIS — Z1231 Encounter for screening mammogram for malignant neoplasm of breast: Secondary | ICD-10-CM

## 2024-01-04 ENCOUNTER — Ambulatory Visit
Admission: RE | Admit: 2024-01-04 | Discharge: 2024-01-04 | Disposition: A | Discharge status: Home / Self Care | Payer: 59 | Source: Ambulatory Visit

## 2024-01-04 DIAGNOSIS — Z1231 Encounter for screening mammogram for malignant neoplasm of breast: Secondary | ICD-10-CM | POA: Insufficient documentation

## 2024-07-24 ENCOUNTER — Other Ambulatory Visit: Payer: Self-pay

## 2024-07-24 DIAGNOSIS — Z1231 Encounter for screening mammogram for malignant neoplasm of breast: Secondary | ICD-10-CM

## 2024-11-30 ENCOUNTER — Other Ambulatory Visit: Payer: Self-pay | Admitting: Internal Medicine

## 2024-11-30 DIAGNOSIS — Z1231 Encounter for screening mammogram for malignant neoplasm of breast: Secondary | ICD-10-CM

## 2025-01-08 ENCOUNTER — Encounter
